# Patient Record
Sex: Male | Born: 1945 | Race: White | Hispanic: No | Marital: Married | Smoking: Never smoker
Health system: Southern US, Community
[De-identification: ages and names within clinical notes are randomized; demographics above are authoritative.]

## PROBLEM LIST (undated history)

## (undated) DIAGNOSIS — C61 Malignant neoplasm of prostate: Secondary | ICD-10-CM

## (undated) HISTORY — PX: PROSTATECTOMY: SHX69

---

## 2004-03-08 ENCOUNTER — Ambulatory Visit (HOSPITAL_COMMUNITY): Admission: RE | Admit: 2004-03-08 | Discharge: 2004-03-08 | Payer: Self-pay | Admitting: Gastroenterology

## 2004-03-08 ENCOUNTER — Encounter (INDEPENDENT_AMBULATORY_CARE_PROVIDER_SITE_OTHER): Payer: Self-pay | Admitting: *Deleted

## 2004-07-24 ENCOUNTER — Encounter (HOSPITAL_COMMUNITY): Admission: RE | Admit: 2004-07-24 | Discharge: 2004-10-22 | Payer: Self-pay | Admitting: General Surgery

## 2005-02-05 ENCOUNTER — Encounter (HOSPITAL_COMMUNITY): Admission: RE | Admit: 2005-02-05 | Discharge: 2005-04-22 | Payer: Self-pay | Admitting: Urology

## 2005-02-13 ENCOUNTER — Ambulatory Visit: Admission: RE | Admit: 2005-02-13 | Discharge: 2005-05-14 | Payer: Self-pay | Admitting: Radiation Oncology

## 2007-05-17 ENCOUNTER — Ambulatory Visit (HOSPITAL_COMMUNITY): Admission: RE | Admit: 2007-05-17 | Discharge: 2007-05-17 | Payer: Self-pay | Admitting: Urology

## 2008-10-12 ENCOUNTER — Encounter
Admission: RE | Admit: 2008-10-12 | Discharge: 2009-01-10 | Payer: Self-pay | Admitting: Physical Medicine and Rehabilitation

## 2009-06-18 ENCOUNTER — Ambulatory Visit (HOSPITAL_COMMUNITY): Admission: RE | Admit: 2009-06-18 | Discharge: 2009-06-18 | Payer: Self-pay | Admitting: Urology

## 2010-02-03 ENCOUNTER — Encounter: Payer: Self-pay | Admitting: Urology

## 2010-05-10 ENCOUNTER — Other Ambulatory Visit: Payer: Self-pay | Admitting: Urology

## 2010-05-10 DIAGNOSIS — C61 Malignant neoplasm of prostate: Secondary | ICD-10-CM

## 2010-05-31 NOTE — Op Note (Signed)
NAMEARLIN, SASS                  ACCOUNT NO.:  [id number]   MEDICAL RECORD NO.:  [id number]          PATIENT TYPE:  AMB   LOCATION:  ENDO                         FACILITY:  [facility]   PHYSICIAN:  John C. Madilyn Fireman, M.D.    DATE OF BIRTH:  10/24/1945   DATE OF PROCEDURE:  03/08/2004  DATE OF DISCHARGE:                                 OPERATIVE REPORT   PROCEDURE:  Colonoscopy with polypectomy   INDICATIONS FOR PROCEDURE:  Average risk colon cancer screening.   PROCEDURE:  The patient was placed in the left lateral decubitus position  and placed on the pulse monitor with continuous low-flow oxygen delivered by  nasal cannula. He was sedated with 100 mcg IV fentanyl and 10 mg IV Versed.  Olympus video colonoscope was inserted into the rectum and advanced to the  cecum, confirmed by transillumination of McBurney's point and visualization  of the ileocecal valve and appendiceal orifice. The prep was suboptimal and  I could not rule out lesions less and 1 cm in all areas.  Otherwise, the  cecum and ascending colon appeared normal with no masses, polyps,  diverticula or other mucosal abnormalities. In the transverse colon, there  was an 8 mm sessile polyp that was fulgurated by hot biopsy.  No other  abnormalities were seen there. The descending and sigmoid colon appeared  normal with limitations of prep as mentioned above. Within the rectum at 10  cm, there was a 1-1.2 cm polyp that was removed by snare. The scope was then  withdrawn and the patient returned to the recovery room in stable condition.  He tolerated the procedure well. There were no immediate complications.   IMPRESSION:  1.  Transverse and rectal polyps.  2.  Somewhat suboptimal prep.   PLAN:  Await histology to determine interval for next colonoscopy.      JCH/MEDQ  D:  03/08/2004  T:  03/08/2004  Job:  [id number]   cc:   Ernestina Penna, M.D.  [address] [place]  [place] [address]  Fax: [phone]

## 2010-06-21 ENCOUNTER — Encounter (HOSPITAL_COMMUNITY)
Admission: RE | Admit: 2010-06-21 | Discharge: 2010-06-21 | Disposition: A | Discharge status: Home / Self Care | Payer: BC Managed Care – PPO | Source: Ambulatory Visit | Attending: Urology | Admitting: Urology

## 2010-06-21 DIAGNOSIS — C61 Malignant neoplasm of prostate: Secondary | ICD-10-CM | POA: Insufficient documentation

## 2010-06-21 MED ORDER — TECHNETIUM TC 99M MEDRONATE IV KIT
23.5000 | PACK | Freq: Once | INTRAVENOUS | Status: AC | PRN
  Administered 2010-06-21: 24 via INTRAVENOUS

## 2010-07-02 ENCOUNTER — Other Ambulatory Visit: Payer: Self-pay | Admitting: Urology

## 2010-07-02 DIAGNOSIS — R52 Pain, unspecified: Secondary | ICD-10-CM

## 2010-07-02 DIAGNOSIS — C61 Malignant neoplasm of prostate: Secondary | ICD-10-CM

## 2010-07-06 ENCOUNTER — Ambulatory Visit (HOSPITAL_COMMUNITY)
Admission: RE | Admit: 2010-07-06 | Discharge: 2010-07-06 | Disposition: A | Discharge status: Home / Self Care | Payer: BC Managed Care – PPO | Source: Ambulatory Visit | Attending: Urology | Admitting: Urology

## 2010-07-06 ENCOUNTER — Other Ambulatory Visit: Payer: Self-pay | Admitting: Urology

## 2010-07-06 DIAGNOSIS — M545 Low back pain, unspecified: Secondary | ICD-10-CM

## 2010-07-06 DIAGNOSIS — M899 Disorder of bone, unspecified: Secondary | ICD-10-CM | POA: Insufficient documentation

## 2010-07-06 DIAGNOSIS — M47814 Spondylosis without myelopathy or radiculopathy, thoracic region: Secondary | ICD-10-CM | POA: Insufficient documentation

## 2010-07-06 DIAGNOSIS — M795 Residual foreign body in soft tissue: Secondary | ICD-10-CM | POA: Insufficient documentation

## 2010-07-06 DIAGNOSIS — Z1389 Encounter for screening for other disorder: Secondary | ICD-10-CM | POA: Insufficient documentation

## 2010-07-06 DIAGNOSIS — C61 Malignant neoplasm of prostate: Secondary | ICD-10-CM | POA: Insufficient documentation

## 2010-07-06 DIAGNOSIS — Q064 Hydromyelia: Secondary | ICD-10-CM | POA: Insufficient documentation

## 2010-07-06 DIAGNOSIS — M8448XA Pathological fracture, other site, initial encounter for fracture: Secondary | ICD-10-CM | POA: Insufficient documentation

## 2010-07-06 DIAGNOSIS — R52 Pain, unspecified: Secondary | ICD-10-CM

## 2010-07-06 DIAGNOSIS — M949 Disorder of cartilage, unspecified: Secondary | ICD-10-CM | POA: Insufficient documentation

## 2010-07-06 MED ORDER — GADOBENATE DIMEGLUMINE 529 MG/ML IV SOLN
20.0000 mL | Freq: Once | INTRAVENOUS | Status: AC | PRN
  Administered 2010-07-06: 20 mL via INTRAVENOUS

## 2012-03-10 IMAGING — CR DG ORBITS FOR FOREIGN BODY
2 series · 2 of 2 positions shown · non-contrast
Comparison: None.

CLINICAL DATA: Pre MRI.  Evaluate for foreign body.  History of
metal work.

ORBITS FOR FOREIGN BODY - 2 VIEW

[w waters (1 of 2)]
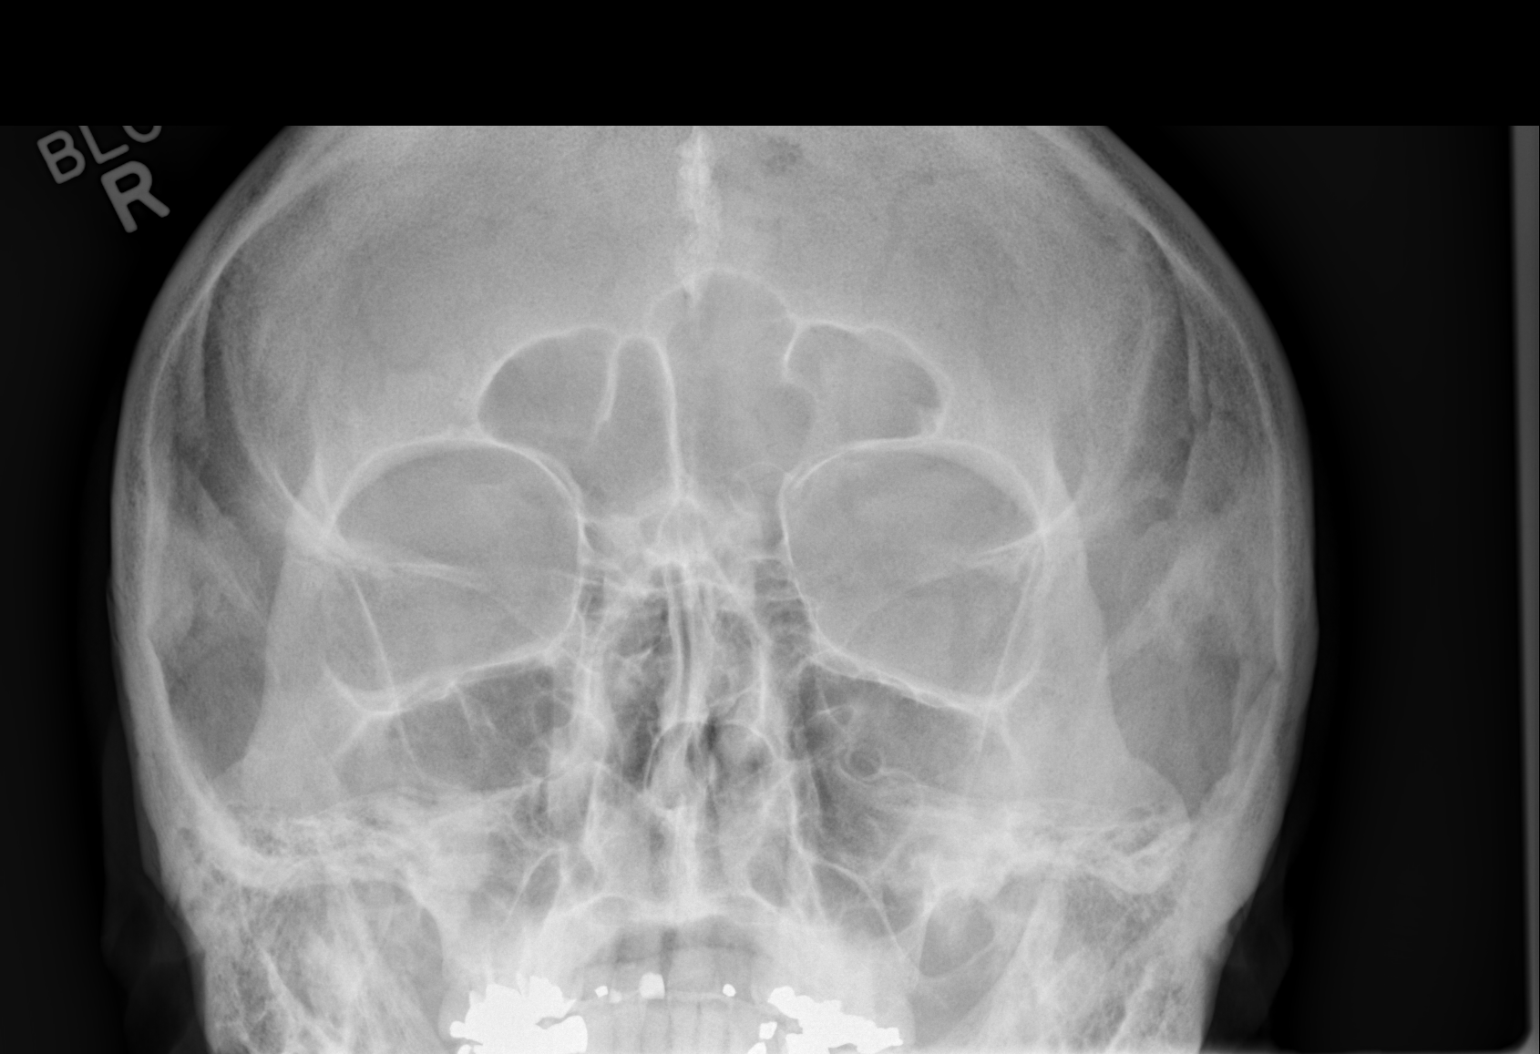

[w waters (2 of 2)]
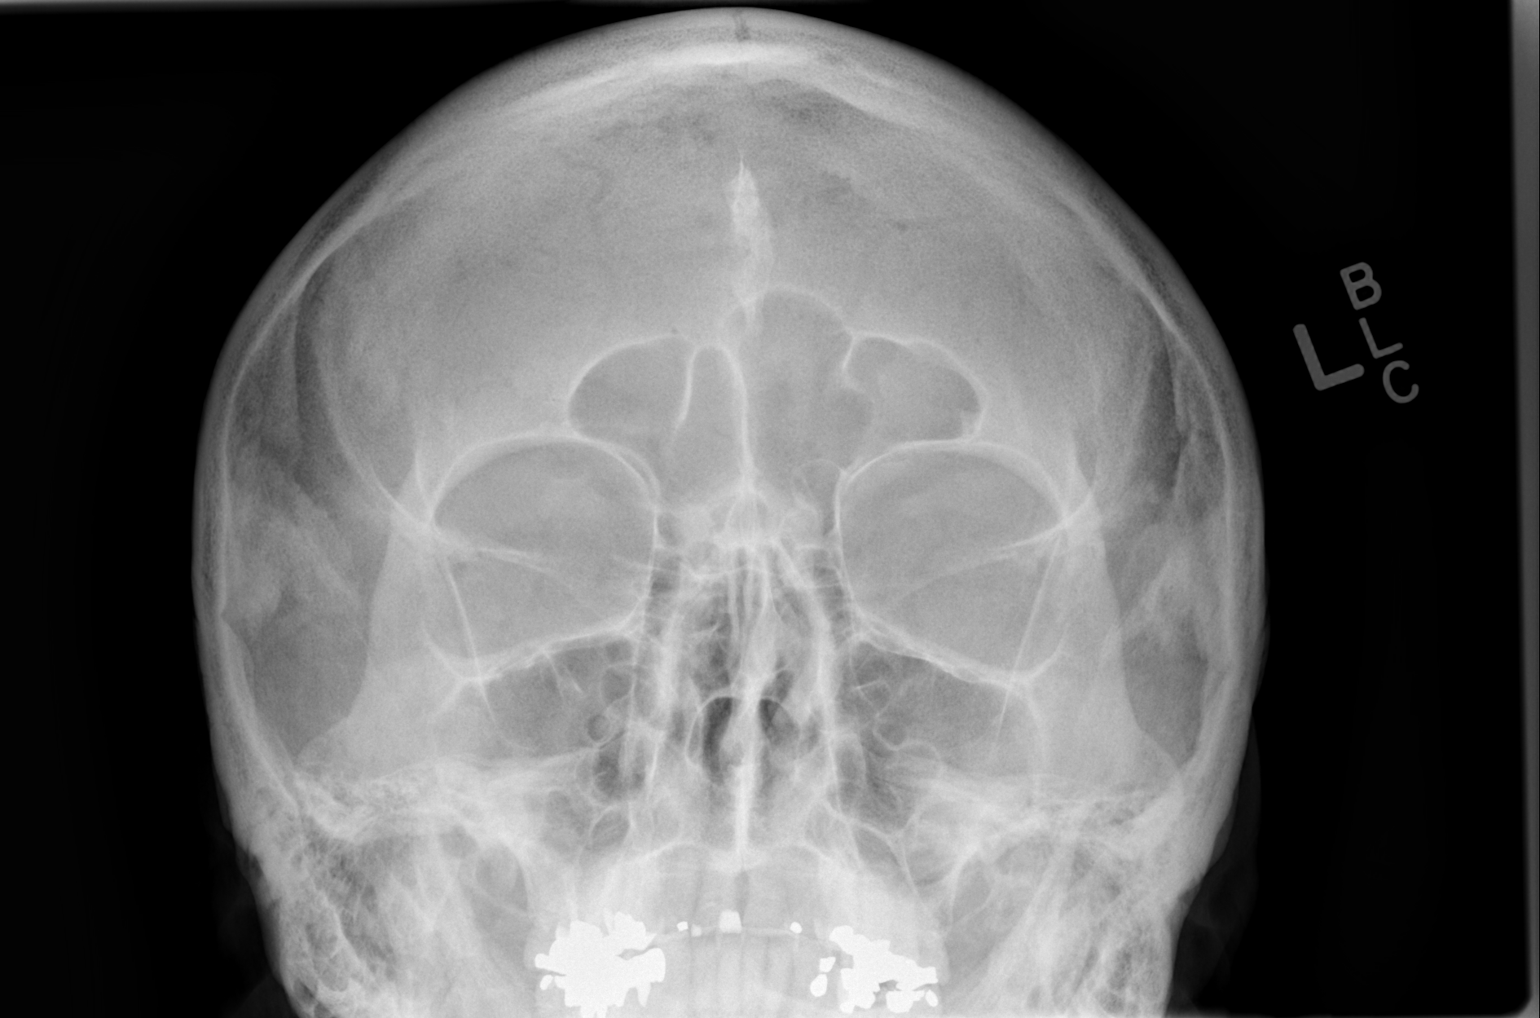

[2 of 2 positions shown; findings below may reference images not displayed]

FINDINGS: No radiopaque foreign bodies projected over the orbits.
The paranasal sinuses and mastoid air cells are clear.  The
visualized skull is unremarkable.
IMPRESSION: 1.  No radiopaque foreign body.
2.  Negative orbits and skull.

## 2013-07-13 ENCOUNTER — Other Ambulatory Visit: Payer: Self-pay | Admitting: Urology

## 2013-07-13 DIAGNOSIS — C61 Malignant neoplasm of prostate: Secondary | ICD-10-CM

## 2013-08-05 ENCOUNTER — Encounter (HOSPITAL_COMMUNITY): Payer: Self-pay

## 2013-08-05 ENCOUNTER — Encounter (HOSPITAL_COMMUNITY)
Admission: RE | Admit: 2013-08-05 | Discharge: 2013-08-05 | Disposition: A | Discharge status: Home / Self Care | Payer: Medicare Other | Source: Ambulatory Visit | Attending: Urology | Admitting: Urology

## 2013-08-05 DIAGNOSIS — C61 Malignant neoplasm of prostate: Secondary | ICD-10-CM | POA: Insufficient documentation

## 2013-08-05 HISTORY — DX: Malignant neoplasm of prostate: C61

## 2013-08-05 MED ORDER — TECHNETIUM TC 99M MEDRONATE IV KIT
26.0000 | PACK | Freq: Once | INTRAVENOUS | Status: AC | PRN
  Administered 2013-08-05: 26 via INTRAVENOUS

## 2015-02-09 ENCOUNTER — Other Ambulatory Visit: Payer: Self-pay | Admitting: Urology

## 2015-02-09 DIAGNOSIS — C61 Malignant neoplasm of prostate: Secondary | ICD-10-CM

## 2015-02-23 ENCOUNTER — Encounter (HOSPITAL_COMMUNITY)
Admission: RE | Admit: 2015-02-23 | Discharge: 2015-02-23 | Disposition: A | Discharge status: Home / Self Care | Payer: Medicare Other | Source: Ambulatory Visit | Attending: Urology | Admitting: Urology

## 2015-02-23 DIAGNOSIS — C61 Malignant neoplasm of prostate: Secondary | ICD-10-CM | POA: Insufficient documentation

## 2015-02-23 MED ORDER — TECHNETIUM TC 99M MEDRONATE IV KIT
26.7000 | PACK | Freq: Once | INTRAVENOUS | Status: AC | PRN
  Administered 2015-02-23: 26.7 via INTRAVENOUS

## 2015-02-26 ENCOUNTER — Ambulatory Visit (HOSPITAL_COMMUNITY)
Admission: RE | Admit: 2015-02-26 | Discharge: 2015-02-26 | Disposition: A | Discharge status: Home / Self Care | Payer: Medicare Other | Source: Ambulatory Visit | Attending: Urology | Admitting: Urology

## 2015-02-26 ENCOUNTER — Other Ambulatory Visit: Payer: Self-pay | Admitting: Urology

## 2015-02-26 DIAGNOSIS — C61 Malignant neoplasm of prostate: Secondary | ICD-10-CM | POA: Diagnosis present

## 2015-02-26 DIAGNOSIS — M160 Bilateral primary osteoarthritis of hip: Secondary | ICD-10-CM | POA: Diagnosis not present

## 2015-02-26 DIAGNOSIS — R948 Abnormal results of function studies of other organs and systems: Secondary | ICD-10-CM | POA: Diagnosis not present

## 2016-02-21 ENCOUNTER — Other Ambulatory Visit: Payer: Self-pay | Admitting: Urology

## 2016-02-21 DIAGNOSIS — C61 Malignant neoplasm of prostate: Secondary | ICD-10-CM

## 2016-02-29 ENCOUNTER — Encounter (HOSPITAL_COMMUNITY)
Admission: RE | Admit: 2016-02-29 | Discharge: 2016-02-29 | Disposition: A | Discharge status: Home / Self Care | Payer: Medicare Other | Source: Ambulatory Visit | Attending: Urology | Admitting: Urology

## 2016-02-29 DIAGNOSIS — C61 Malignant neoplasm of prostate: Secondary | ICD-10-CM | POA: Diagnosis present

## 2016-02-29 DIAGNOSIS — R948 Abnormal results of function studies of other organs and systems: Secondary | ICD-10-CM | POA: Insufficient documentation

## 2016-02-29 MED ORDER — TECHNETIUM TC 99M MEDRONATE IV KIT
21.4000 | PACK | Freq: Once | INTRAVENOUS | Status: AC | PRN
  Administered 2016-02-29: 21.4 via INTRAVENOUS

## 2016-03-03 NOTE — Progress Notes (Addendum)
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  Past/Anticipated interventions by urology, if any: yes, prostatectomy, surveillance, Lupron  Past/Anticipated interventions by medical oncology, if any: no  Weight changes, if any: no  Bowel/Bladder complaints, if any: IPSS 10. Denies dysuria or hematuria. Reports occasional leakage during the day. Reports leakage at night resolved with vesicare.  Nausea/Vomiting, if any: no  Pain issues, if any:  Arthritic pain in spine. Left knee pain from injury. Walking with aid of a cane  SAFETY ISSUES:  Prior radiation? Yes; in 2003 he received 38 treatments  Pacemaker/ICD? no  Possible current pregnancy? no  Is the patient on methotrexate? no  Current Complaints / other details:  71 year old male. Married. 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 Since DVT right leg has remain swollen compared to his left leg. He reports numbness in his left leg.   BP 133/68 (BP Location: Left Arm, Patient Position: Sitting, Cuff Size: Normal)   Pulse 64   Temp 98.7 F (37.1 C) (Oral)   Resp 18   Wt 268 lb 9.6 oz (121.8 kg)   SpO2 100%  Wt Readings from Last 3 Encounters:  03/06/16 268 lb 9.6 oz (121.8 kg)

## 2016-03-06 ENCOUNTER — Ambulatory Visit
Admission: RE | Admit: 2016-03-06 | Discharge: 2016-03-06 | Disposition: A | Discharge status: Home / Self Care | Payer: Medicare Other | Source: Ambulatory Visit | Attending: Radiation Oncology | Admitting: Radiation Oncology

## 2016-03-06 ENCOUNTER — Encounter: Payer: Self-pay | Admitting: Radiation Oncology

## 2016-03-06 VITALS — BP 133/68 | HR 64 | Temp 98.7°F | Resp 18 | Wt 268.6 lb

## 2016-03-06 DIAGNOSIS — C61 Malignant neoplasm of prostate: Secondary | ICD-10-CM | POA: Diagnosis not present

## 2016-03-06 DIAGNOSIS — Z510 Encounter for antineoplastic radiation therapy: Secondary | ICD-10-CM | POA: Diagnosis present

## 2016-03-06 DIAGNOSIS — Z923 Personal history of irradiation: Secondary | ICD-10-CM | POA: Diagnosis not present

## 2016-03-06 DIAGNOSIS — Z7982 Long term (current) use of aspirin: Secondary | ICD-10-CM | POA: Insufficient documentation

## 2016-03-06 DIAGNOSIS — Z79899 Other long term (current) drug therapy: Secondary | ICD-10-CM | POA: Diagnosis not present

## 2016-03-06 DIAGNOSIS — Z7901 Long term (current) use of anticoagulants: Secondary | ICD-10-CM | POA: Diagnosis not present

## 2016-03-06 DIAGNOSIS — Z8546 Personal history of malignant neoplasm of prostate: Secondary | ICD-10-CM | POA: Diagnosis not present

## 2016-03-06 DIAGNOSIS — Z9079 Acquired absence of other genital organ(s): Secondary | ICD-10-CM | POA: Diagnosis not present

## 2016-03-06 NOTE — Progress Notes (Signed)
Radiation Oncology         [phone] ________________________________  Initial Outpatient Consultation  Name: Cameron Butler MRN: [id number]  Date: 03/06/2016  DOB: [birth year]  [id number] R, MD  Irine Seal, MD   REFERRING PHYSICIAN: Irine Seal, MD  DIAGNOSIS: 71 yo man with isolated right iliac adenopathy from recurrent prostate cancer    ICD-9-CM ICD-10-CM   1. Malignant neoplasm of prostate ([place]) [facility] referral to Social Work    HISTORY OF PRESENT ILLNESS: Cameron Butler is a 71 y.o. male seen at the request of Dr. Jeffie Pollock. The patient underwent prostatectomy in 2003 and subsequent radiation in 2007. Several years later, after a subsequent PSA elevation of 38 and discovery of an enlarged pelvic node, the patient was started on ADT with Lupron injections. He continued on ADT for several years but stopped hormone therapy around 2016 and his PSA has since risen. His most recent PSA from 02/20/16 was 38.9. The patient reports that Dr. Jeffie Pollock had concerns about continuation of ADT causing dementia and other significant side effects, therefore, he has been referred to the clinic today to discuss the role that radiation may play in the treatment of his disease.   Recent bone scan on 02/29/16 showed areas of slight increased activity noted about the thoracic and lumbar spine and sternum, which are stable as compared to prior studies.  CT Abdomen and Pelvis on the same day showed new right pelvic sidewall lymphadenopathy, with a 31mm lymph node in the right external iliac chain and an adjacent 12mm node, highly suspicious for metastatic disease given the patient's history of rising PSA.   PREVIOUS RADIATION THERAPY: Yes  03/17/05 - 05/08/05 : 68.4 Gy delivered in 28 fractions to the Prostatic Fossa with Dr. Tammi Klippel  PAST MEDICAL HISTORY:  Past Medical History:  Diagnosis Date  . Prostate cancer ([place])       PAST SURGICAL HISTORY: Past Surgical History:  Procedure Laterality Date  .  PROSTATECTOMY      FAMILY HISTORY:  Family History  Problem Relation Age of Onset  . Cancer Neg Hx     SOCIAL HISTORY:  Social History   Social History  . Marital status: Married    Spouse name: N/A  . Number of children: N/A  . Years of education: N/A   Occupational History  . Not on file.   Social History Main Topics  . Smoking status: Never Smoker  . Smokeless tobacco: Never Used  . Alcohol use No  . Drug use: No  . Sexual activity: No   Other Topics Concern  . Not on file   Social History Narrative  . No narrative on file    ALLERGIES: Patient has no known allergies.  MEDICATIONS:  Current Outpatient Prescriptions  Medication Sig Dispense Refill  . acetaminophen (TYLENOL) 325 MG tablet Take 650 mg by mouth every 6 (six) hours as needed.    Marland Kitchen aspirin EC 81 MG tablet Take 81 mg by mouth daily.    Marland Kitchen atorvastatin (LIPITOR) 20 MG tablet Take 20 mg by mouth daily.    Marland Kitchen buPROPion (WELLBUTRIN) 100 MG tablet Take 100 mg by mouth 2 (two) times daily.    . calcium-vitamin D (OSCAL WITH D) 250-125 MG-UNIT tablet Take 1 tablet by mouth daily.    . cetirizine (ZYRTEC) 10 MG tablet Take 10 mg by mouth daily.    Marland Kitchen escitalopram (LEXAPRO) 10 MG tablet Take 10 mg by mouth daily.    Marland Kitchen gabapentin (NEURONTIN) 800 MG  tablet Take 1 tablet by mouth at bedtime    . metoprolol succinate (TOPROL-XL) 100 MG 24 hr tablet Take 100 mg by mouth daily. Take with or immediately following a meal.    . Multiple Vitamins-Minerals (ICAPS MV PO) Take by mouth.    . simvastatin (ZOCOR) 20 MG tablet Take 20 mg by mouth daily.    . solifenacin (VESICARE) 5 MG tablet Take 5 mg by mouth daily.    Marland Kitchen warfarin (COUMADIN) 5 MG tablet Take 5 mg by mouth daily.     No current facility-administered medications for this encounter.     REVIEW OF SYSTEMS:  On review of systems, the patient reports that he is doing well overall. He denies any chest pain, shortness of breath, cough, fevers, chills, night sweats,  unintended weight changes. He denies any bowel disturbances, and reports occasional urinary leakage through the day; leakage at night was resolved with Vesicare. He denies dysuria or hematuria. He denies abdominal pain, nausea or vomiting. He reports arthritic pain in his spine, as well as left knee pain form a prior injury. The patient reports ongoing edema to the right leg related to a previous blood clot. A complete review of systems is obtained and is otherwise negative.  The patient completed an IPSS and IIEF questionnaire.  His IPSS score was 10 indicating moderate urinary outflow obstructive symptoms.     PHYSICAL EXAM:  Wt Readings from Last 3 Encounters:  03/06/16 268 lb 9.6 oz (121.8 kg)   Temp Readings from Last 3 Encounters:  03/06/16 98.7 F (37.1 C) (Oral)   BP Readings from Last 3 Encounters:  03/06/16 133/68   Pulse Readings from Last 3 Encounters:  03/06/16 64   In general this is a well appearing Caucasian male in no acute distress. He is alert and oriented x4 and appropriate throughout the examination. HEENT reveals that the patient is normocephalic, atraumatic. EOMs are intact. PERRLA. Skin is intact without any evidence of gross lesions. Cardiovascular exam reveals a regular rate and rhythm, no clicks rubs or murmurs are auscultated. Chest is clear to auscultation bilaterally. Lymphatic assessment is performed and does not reveal any adenopathy in the cervical, supraclavicular, axillary, or inguinal chains. Abdomen has active bowel sounds in all quadrants and is intact. The abdomen is soft, non tender, non distended. Lower extremities are negative for pretibial pitting edema, deep calf tenderness, cyanosis or clubbing.  KPS = 90  100 - Normal; no complaints; no evidence of disease. 90   - Able to carry on normal activity; minor signs or symptoms of disease. 80   - Normal activity with effort; some signs or symptoms of disease. 73   - Cares for self; unable to carry on  normal activity or to do active work. 60   - Requires occasional assistance, but is able to care for most of his personal needs. 50   - Requires considerable assistance and frequent medical care. 73   - Disabled; requires special care and assistance. 61   - Severely disabled; hospital admission is indicated although death not imminent. 27   - Very sick; hospital admission necessary; active supportive treatment necessary. 10   - Moribund; fatal processes progressing rapidly. 0     - Dead  Karnofsky DA, Abelmann WH, Craver LS and Burchenal [facility] [phone]) The use of the nitrogen mustards in the palliative treatment of carcinoma: with particular reference to bronchogenic carcinoma Cancer 1 634-56  LABORATORY DATA:  No results found for: WBC, HGB, HCT,  MCV, PLT No results found for: NA, K, CL, CO2 No results found for: ALT, AST, GGT, ALKPHOS, BILITOT   RADIOGRAPHY: Nm Bone Scan Whole Body  Result Date: 02/29/2016 CLINICAL DATA:  Left knee pain.  No injury.  Elevated PSA. EXAM: NUCLEAR MEDICINE WHOLE BODY BONE SCAN TECHNIQUE: Whole body anterior and posterior images were obtained approximately 3 hours after intravenous injection of radiopharmaceutical. RADIOPHARMACEUTICALS:  From 0.4 MCi Technetium-16m MDP IV COMPARISON:  02/23/2015. FINDINGS: Bilateral renal function excretion. Subtle areas of increased activity noted throughout the thoracic and lumbar spine previously described are stable. Punctate area of increased activity about the sternum is stable. Previously identified increased activity in the right ischium has decreased on today's exam. Increased activity noted over the left knee, this is most likely degenerative. Left knee series can be obtained for further evaluation. IMPRESSION: 1. New onset increased activity about the left knee, this is most likely degenerative. Right knee series suggested for further evaluation. 2. Remaining areas of slight increased activity noted about the thoracic and  lumbar spine, and sternum are stable. This may be degenerative. Previously identified area of increased activity in the right ischium is less prominent on today's exam. Electronically Signed   By: McGregor   On: 02/29/2016 14:32      IMPRESSION/PLAN: 1. This is a well appearing 71 y.o. 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  Accordingly he is eligible for salvage ablative treatment of oligometastatic disease SBRT.  Today, I talked to the patient about the findings and work-up thus far.  We discussed the natural history of prostate cancer and general treatment, highlighting the role of radiotherapy in the management.  We discussed the available radiation techniques, and focused on the details of logistics and delivery.  We reviewed the anticipated acute and late sequelae associated with radiation in this setting.  The patient was encouraged to ask questions that I answered to the best of my ability.  The patient would like to proceed with SBRT and will be scheduled for CT simulation and treatment planning 03/07/16 at 10am.  I spent 60 minutes minutes face to face with the patient and more than 50% of that time was spent in counseling and/or coordination of care.  The above documentation reflects our direct findings during this shared patient visit.    Nicholos Johns, PA-C    Tyler Pita, MD  [facility] Direct Dial: [phone]  Fax: [phone] [email or web address]  Skype  [email or web address]  This document serves as a record of services personally performed by Tyler Pita, MD and Freeman Caldron, PA-C. It was created on their behalf by Maryla Morrow, a trained medical scribe. The creation of this record is based on the scribe's personal observations and the provider's statements to them. This document has been checked and approved by the attending provider.

## 2016-03-06 NOTE — Progress Notes (Signed)
See progress note under physician encounter. 

## 2016-03-07 ENCOUNTER — Ambulatory Visit
Admission: RE | Admit: 2016-03-07 | Discharge: 2016-03-07 | Disposition: A | Discharge status: Home / Self Care | Payer: Medicare Other | Source: Ambulatory Visit | Attending: Radiation Oncology | Admitting: Radiation Oncology

## 2016-03-07 DIAGNOSIS — Z510 Encounter for antineoplastic radiation therapy: Secondary | ICD-10-CM | POA: Diagnosis not present

## 2016-03-07 DIAGNOSIS — C61 Malignant neoplasm of prostate: Secondary | ICD-10-CM

## 2016-03-07 NOTE — Progress Notes (Signed)
  Radiation Oncology         [phone]) [phone] ________________________________  Name: Kentarius Laible MRN: [id number]  Date: 03/07/2016  DOB: 1945/09/11  STEREOTACTIC BODY RADIOTHERAPY SIMULATION AND TREATMENT PLANNING NOTE    ICD-9-CM ICD-10-CM   1. 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  Identity was confirmed.  All relevant records and images related to the planned course of therapy were reviewed.  The patient freely provided informed written consent to proceed with treatment after reviewing the details related to the planned course of therapy. The consent form was witnessed and verified by the simulation staff.  Then, the patient was set-up in a stable reproducible  supine position for radiation therapy.  A BodyFix immobilization pillow was fabricated for reproducible positioning.  Surface markings were placed.  The CT images were loaded into the planning software.  The gross target volumes (GTV) and planning target volumes (PTV) were delinieated, and avoidance structures were contoured.  Treatment planning then occurred.  The radiation prescription was entered and confirmed.  A total of two complex treatment devices were fabricated in the form of the BodyFix immobilization pillow and a neck accuform cushion.  I have requested : 3D Simulation  I have requested a DVH of the following structures: targets and all normal structures near the target including small bowel, bladder and right femur as noted on the radiation plan to maintain doses in adherence with established limits  SPECIAL TREATMENT PROCEDURE:  The planned course of therapy using radiation constitutes a special treatment procedure. Special care is required in the management of this patient for the following reasons. This treatment constitutes a Special Treatment Procedure  for the following reason: [ High dose per fraction requiring special monitoring for increased toxicities of treatment including daily imaging..  The special nature of the planned course of radiotherapy will require increased physician supervision and oversight to ensure patient's safety with optimal treatment outcomes.  PLAN:  The patient will receive 40 Gy in 5 fractions.  ________________________________  Sheral Apley Tammi Klippel, M.D.

## 2016-03-13 DIAGNOSIS — Z510 Encounter for antineoplastic radiation therapy: Secondary | ICD-10-CM | POA: Diagnosis not present

## 2016-03-26 ENCOUNTER — Ambulatory Visit
Admission: RE | Admit: 2016-03-26 | Discharge: 2016-03-26 | Disposition: A | Discharge status: Home / Self Care | Payer: Medicare Other | Source: Ambulatory Visit | Attending: Radiation Oncology | Admitting: Radiation Oncology

## 2016-03-26 DIAGNOSIS — Z510 Encounter for antineoplastic radiation therapy: Secondary | ICD-10-CM | POA: Diagnosis not present

## 2016-03-27 ENCOUNTER — Ambulatory Visit: Payer: Medicare Other | Admitting: Radiation Oncology

## 2016-03-28 ENCOUNTER — Ambulatory Visit
Admission: RE | Admit: 2016-03-28 | Discharge: 2016-03-28 | Disposition: A | Discharge status: Home / Self Care | Payer: Medicare Other | Source: Ambulatory Visit | Attending: Radiation Oncology | Admitting: Radiation Oncology

## 2016-03-28 DIAGNOSIS — Z510 Encounter for antineoplastic radiation therapy: Secondary | ICD-10-CM | POA: Diagnosis not present

## 2016-03-31 ENCOUNTER — Ambulatory Visit
Admission: RE | Admit: 2016-03-31 | Discharge: 2016-03-31 | Disposition: A | Discharge status: Home / Self Care | Payer: Medicare Other | Source: Ambulatory Visit | Attending: Radiation Oncology | Admitting: Radiation Oncology

## 2016-03-31 DIAGNOSIS — Z510 Encounter for antineoplastic radiation therapy: Secondary | ICD-10-CM | POA: Diagnosis not present

## 2016-04-01 ENCOUNTER — Ambulatory Visit: Payer: Medicare Other | Admitting: Radiation Oncology

## 2016-04-02 ENCOUNTER — Ambulatory Visit
Admission: RE | Admit: 2016-04-02 | Discharge: 2016-04-02 | Disposition: A | Discharge status: Home / Self Care | Payer: Medicare Other | Source: Ambulatory Visit | Attending: Radiation Oncology | Admitting: Radiation Oncology

## 2016-04-02 DIAGNOSIS — Z510 Encounter for antineoplastic radiation therapy: Secondary | ICD-10-CM | POA: Diagnosis not present

## 2016-04-04 ENCOUNTER — Ambulatory Visit
Admission: RE | Admit: 2016-04-04 | Discharge: 2016-04-04 | Disposition: A | Discharge status: Home / Self Care | Payer: Medicare Other | Source: Ambulatory Visit | Attending: Radiation Oncology | Admitting: Radiation Oncology

## 2016-04-04 ENCOUNTER — Encounter: Payer: Self-pay | Admitting: Radiation Oncology

## 2016-04-04 DIAGNOSIS — Z510 Encounter for antineoplastic radiation therapy: Secondary | ICD-10-CM | POA: Diagnosis not present

## 2016-04-07 NOTE — Progress Notes (Signed)
  Radiation Oncology         [phone]) [phone] ________________________________  Name: Cameron Butler MRN: [id number]  Date: 04/04/2016  DOB: [birth year]  End of Treatment Note  Diagnosis:   71 y.o. 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      Indication for treatment:  Curative       Radiation treatment dates:   03/26/2016 to 04/04/2016  Site/dose:   The Right iliac nodes were treated to 40 Gy in 5 fractions at 8 Gy per fraction.   Beams/energy:  SBRT/SRT-VMAT // 10FFF  Narrative: The patient tolerated radiation treatment relatively well.  He reports unchanged right hip and left knee pain with treatment. He takes morphine for pain management. He denies constipation. He does report occasional nausea but, denies emesis.   Plan: The patient has completed radiation treatment. The patient will return to radiation oncology clinic for routine followup in one month. I advised him to call or return sooner if he has any questions or concerns related to his recovery or treatment. ________________________________  Sheral Apley. Tammi Klippel, M.D.  This document serves as a record of services personally performed by Tyler Pita, MD. It was created on his behalf by Arlyce Harman, a trained medical scribe. The creation of this record is based on the scribe's personal observations and the provider's statements to them. This document has been checked and approved by the attending provider.

## 2016-05-12 NOTE — Progress Notes (Signed)
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      Interval Since Last Radiation:  6 weeks  03/26/2016 to 04/04/2016:  The Right iliac nodes were treated to 40 Gy in 5 fractions at 8 Gy per fraction.    Narrative:  The patient returns today for routine follow-up.  He tolerated radiation treatments relatively well aside from chronic right hip and knee pain which remained unchanged throughout treatment.   His PSA had reached 38.9 prior to starting treatment.  He has not had follow up with Dr. Jeffie Pollock since completing treatment.                         On review of systems, the patient states that he is doing well.  He continues with baseline frequency, urgency and UUI which is treated with Vesicare.  He had temporarily stopped his Vesicare but has recently resumed this and reports that it helps significantly. He denies dysuria, gross hematuria, flank pain, fever or chills.  He has a good appetite and is maintaining his weight.  He denies abdominal pain, N/V or diarrhea.  His BMs are of normal consistency and occur regularly.  ALLERGIES:  has No Known Allergies.  Meds: Current Outpatient Prescriptions  Medication Sig Dispense Refill  . acetaminophen (TYLENOL) 325 MG tablet Take 650 mg by mouth every 6 (six) hours as needed.    Marland Kitchen aspirin EC 81 MG tablet Take 81 mg by mouth daily.    Marland Kitchen atorvastatin (LIPITOR) 20 MG tablet Take 20 mg by mouth daily.    Marland Kitchen buPROPion (WELLBUTRIN) 100 MG tablet Take 100 mg by mouth 2 (two) times daily.    . calcium-vitamin D (OSCAL WITH D) 250-125 MG-UNIT tablet Take 1 tablet by mouth daily.    . cetirizine (ZYRTEC) 10 MG tablet Take 10 mg by mouth daily.    . metoprolol succinate (TOPROL-XL) 100 MG 24 hr  tablet Take 100 mg by mouth 2 (two) times daily. Take with or immediately following a meal.     . Multiple Vitamins-Minerals (ICAPS MV PO) Take by mouth.    . simvastatin (ZOCOR) 20 MG tablet Take 20 mg by mouth daily.    . solifenacin (VESICARE) 5 MG tablet Take 5 mg by mouth daily.    Marland Kitchen warfarin (COUMADIN) 5 MG tablet Take 5 mg by mouth daily.    Marland Kitchen escitalopram (LEXAPRO) 10 MG tablet Take 10 mg by mouth daily.    Marland Kitchen gabapentin (NEURONTIN) 800 MG tablet Take 1 tablet by mouth at bedtime     No current facility-administered medications for this encounter.     Physical Findings:  height is 5\' 10"  (1.778 m) and weight is 280 lb 6.4 oz (127.2 kg). His oral temperature is 97.5 F (36.4 C). His blood pressure is 139/84 and his pulse is 94. His respiration is 18 and oxygen saturation is 99%.  Pain Assessment Pain Score: 4  (Back)/10 In general this is a well appearing caucasian male in no acute distress. He's alert and oriented x4 and appropriate throughout the examination. Cardiopulmonary assessment is negative for acute distress and he exhibits normal effort.   Lab Findings: No results found for: WBC, HGB, HCT, MCV,  PLT   Radiographic Findings: No results found.  Impression/Plan: 1. 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     He will continue to follow up with urology for ongoing PSA determinations and will call for a follow up appointment with Dr. Jeffie Pollock. He understands what to expect with regards to PSA monitoring going forward. I will look forward to following his response to correspondence with urology, and would be happy to continue to participate in his care if clinically indicated.  I encouraged him to call or return to the office if he has any questions regarding his previous radiation or possible radiation side effects. He was comfortable with this plan and will follow up as needed.    Nicholos Johns, PA-C

## 2016-05-12 NOTE — Progress Notes (Addendum)
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  Weight changes, if any:  Wt Readings from Last 3 Encounters:  05/13/16 280 lb 6.4 oz (127.2 kg)  03/06/16 268 lb 9.6 oz (121.8 kg)    Bowel/Bladder complaints, if any: Taking vesicare, voiding every two hours.  Having bowel movement daily to every other day.  Nausea/Vomiting, if any: no  Pain issues, if 4/10 to back Reports atrial fibrillation for around four week scheduled for cardioversion 05-19-16. BP 139/84   Pulse 94   Temp 97.5 F (36.4 C) (Oral)   Resp 18   Ht 5\' 10"  (1.778 m)   Wt 280 lb 6.4 oz (127.2 kg)   SpO2 99%   BMI 40.23 kg/m

## 2016-05-13 ENCOUNTER — Encounter: Payer: Self-pay | Admitting: Urology

## 2016-05-13 ENCOUNTER — Ambulatory Visit
Admission: RE | Admit: 2016-05-13 | Discharge: 2016-05-13 | Disposition: A | Discharge status: Home / Self Care | Payer: Medicare Other | Source: Ambulatory Visit | Attending: Urology | Admitting: Urology

## 2016-05-13 VITALS — BP 139/84 | HR 94 | Temp 97.5°F | Resp 18 | Ht 70.0 in | Wt 280.4 lb

## 2016-05-13 DIAGNOSIS — Z923 Personal history of irradiation: Secondary | ICD-10-CM | POA: Insufficient documentation

## 2016-05-13 DIAGNOSIS — I4891 Unspecified atrial fibrillation: Secondary | ICD-10-CM | POA: Insufficient documentation

## 2016-05-13 DIAGNOSIS — Z79899 Other long term (current) drug therapy: Secondary | ICD-10-CM | POA: Diagnosis not present

## 2016-05-13 DIAGNOSIS — Z7982 Long term (current) use of aspirin: Secondary | ICD-10-CM | POA: Insufficient documentation

## 2016-05-13 DIAGNOSIS — Z7901 Long term (current) use of anticoagulants: Secondary | ICD-10-CM | POA: Insufficient documentation

## 2016-05-13 DIAGNOSIS — C61 Malignant neoplasm of prostate: Secondary | ICD-10-CM

## 2016-05-13 DIAGNOSIS — R599 Enlarged lymph nodes, unspecified: Secondary | ICD-10-CM | POA: Insufficient documentation

## 2016-05-14 ENCOUNTER — Telehealth: Payer: Self-pay | Admitting: *Deleted

## 2016-05-14 NOTE — Telephone Encounter (Signed)
CALLED PATIENT TO INFORM OF FU APPT. WITH DR. Jeffie Pollock ON 06-23-16 - ARRIVAL TIME - 12:15 PM, SPOKE WITH PATIENT'S WIFE JUDY AND SHE IS AWARE OF THIS APPT.

## 2017-02-10 ENCOUNTER — Other Ambulatory Visit: Payer: Self-pay | Admitting: Urology

## 2017-02-10 DIAGNOSIS — C61 Malignant neoplasm of prostate: Secondary | ICD-10-CM

## 2017-02-19 ENCOUNTER — Encounter (HOSPITAL_COMMUNITY)
Admission: RE | Admit: 2017-02-19 | Discharge: 2017-02-19 | Disposition: A | Discharge status: Home / Self Care | Payer: Medicare Other | Source: Ambulatory Visit | Attending: Urology | Admitting: Urology

## 2017-02-19 DIAGNOSIS — C61 Malignant neoplasm of prostate: Secondary | ICD-10-CM | POA: Insufficient documentation

## 2017-02-19 MED ORDER — AXUMIN (FLUCICLOVINE F 18) INJECTION
9.3000 | Freq: Once | INTRAVENOUS | Status: AC
  Administered 2017-02-19: 9.3 via INTRAVENOUS

## 2017-11-03 IMAGING — NM NM BONE WHOLE BODY
2 series · 2 of 2 positions shown · non-contrast
Comparison: 02/23/2015.

CLINICAL DATA: Left knee pain.  No injury.  Elevated PSA.

EXAM:
NUCLEAR MEDICINE WHOLE BODY BONE SCAN
TECHNIQUE: Whole body anterior and posterior images were obtained approximately
3 hours after intravenous injection of radiopharmaceutical.
RADIOPHARMACEUTICALS:  From 0.4 MCi Jechnetium-GGm MDP IV

[Series 1: whole body · 2.66mm/px · 1 of 1 slices shown (1 of 2)]
[im 1/1]
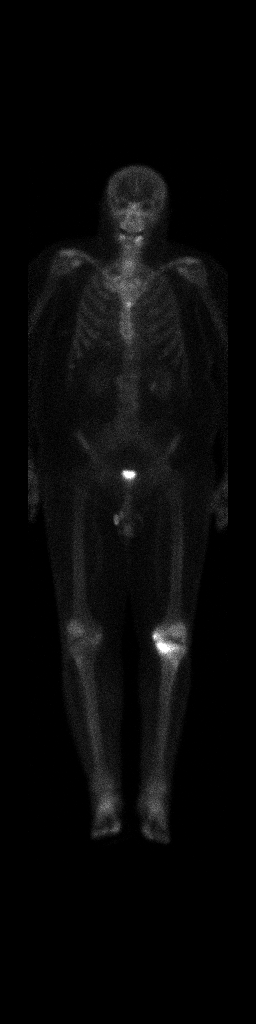

[Series 1: whole body · 2.66mm/px · 1 of 1 slices shown (2 of 2)]
[im 1/1]
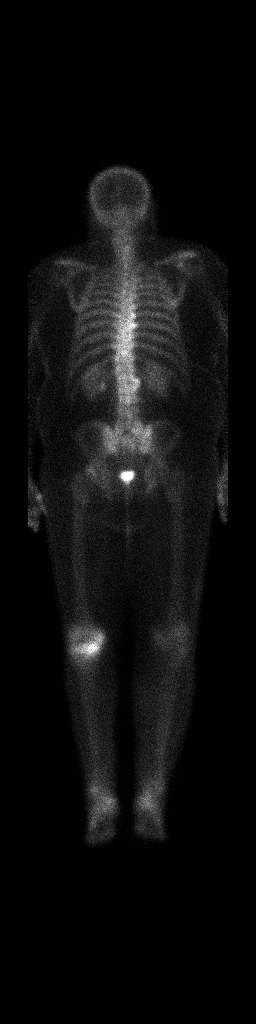

[2 of 2 positions shown; findings below may reference images not displayed]

FINDINGS: Bilateral renal function excretion. Subtle areas of increased
activity noted throughout the thoracic and lumbar spine previously
described are stable. Punctate area of increased activity about the
sternum is stable. Previously identified increased activity in the
right ischium has decreased on today's exam. Increased activity
noted over the left knee, this is most likely degenerative. Left
knee series can be obtained for further evaluation.
IMPRESSION: 1. New onset increased activity about the left knee, this is most
likely degenerative. Right knee series suggested for further
evaluation.

2. Remaining areas of slight increased activity noted about the
thoracic and lumbar spine, and sternum are stable. This may be
degenerative. Previously identified area of increased activity in
the right ischium is less prominent on today's exam.

## 2024-02-01 ENCOUNTER — Other Ambulatory Visit (HOSPITAL_COMMUNITY): Payer: Self-pay | Admitting: Urology

## 2024-02-01 DIAGNOSIS — C61 Malignant neoplasm of prostate: Secondary | ICD-10-CM

## 2024-02-01 DIAGNOSIS — R310 Gross hematuria: Secondary | ICD-10-CM

## 2024-02-01 DIAGNOSIS — R9721 Rising PSA following treatment for malignant neoplasm of prostate: Secondary | ICD-10-CM

## 2024-02-12 ENCOUNTER — Ambulatory Visit (HOSPITAL_COMMUNITY): Admission: RE | Admit: 2024-02-12 | Source: Ambulatory Visit

## 2024-02-24 ENCOUNTER — Other Ambulatory Visit (HOSPITAL_COMMUNITY)
